# Patient Record
Sex: Male | Born: 1960 | Race: White | Hispanic: No | Marital: Married
Health system: Southern US, Community
[De-identification: ages and names within clinical notes are randomized; demographics above are authoritative.]

---

## 2015-11-17 ENCOUNTER — Emergency Department (HOSPITAL_COMMUNITY)
Admission: EM | Admit: 2015-11-17 | Discharge: 2015-11-18 | Disposition: A | Discharge status: Home / Self Care | Payer: BLUE CROSS/BLUE SHIELD | Attending: Emergency Medicine | Admitting: Emergency Medicine

## 2015-11-17 ENCOUNTER — Emergency Department (HOSPITAL_COMMUNITY): Payer: BLUE CROSS/BLUE SHIELD

## 2015-11-17 DIAGNOSIS — Z791 Long term (current) use of non-steroidal anti-inflammatories (NSAID): Secondary | ICD-10-CM | POA: Insufficient documentation

## 2015-11-17 DIAGNOSIS — B349 Viral infection, unspecified: Secondary | ICD-10-CM | POA: Diagnosis not present

## 2015-11-17 DIAGNOSIS — Z79899 Other long term (current) drug therapy: Secondary | ICD-10-CM | POA: Insufficient documentation

## 2015-11-17 DIAGNOSIS — R52 Pain, unspecified: Secondary | ICD-10-CM | POA: Diagnosis present

## 2015-11-17 LAB — CBC WITH DIFFERENTIAL/PLATELET
BASOS ABS: 0 10*3/uL (ref 0.0–0.1)
Basophils Relative: 0 %
EOS ABS: 0 10*3/uL (ref 0.0–0.7)
Eosinophils Relative: 0 %
HEMATOCRIT: 40.8 % (ref 39.0–52.0)
HEMOGLOBIN: 13.2 g/dL (ref 13.0–17.0)
LYMPHS PCT: 8 %
Lymphs Abs: 2 10*3/uL (ref 0.7–4.0)
MCH: 25.9 pg — ABNORMAL LOW (ref 26.0–34.0)
MCHC: 32.4 g/dL (ref 30.0–36.0)
MCV: 80 fL (ref 78.0–100.0)
MONOS PCT: 9 %
Monocytes Absolute: 2.2 10*3/uL — ABNORMAL HIGH (ref 0.1–1.0)
NEUTROS ABS: 20.2 10*3/uL — AB (ref 1.7–7.7)
NEUTROS PCT: 83 %
Platelets: 412 10*3/uL — ABNORMAL HIGH (ref 150–400)
RBC: 5.1 MIL/uL (ref 4.22–5.81)
RDW: 19.2 % — ABNORMAL HIGH (ref 11.5–15.5)
WBC: 24.4 10*3/uL — ABNORMAL HIGH (ref 4.0–10.5)

## 2015-11-17 LAB — URINALYSIS, ROUTINE W REFLEX MICROSCOPIC
Bilirubin Urine: NEGATIVE
Glucose, UA: NEGATIVE mg/dL
Hgb urine dipstick: NEGATIVE
Ketones, ur: NEGATIVE mg/dL
NITRITE: NEGATIVE
PH: 8 (ref 5.0–8.0)
Protein, ur: NEGATIVE mg/dL
SPECIFIC GRAVITY, URINE: 1.029 (ref 1.005–1.030)

## 2015-11-17 LAB — BASIC METABOLIC PANEL
ANION GAP: 9 (ref 5–15)
BUN: 15 mg/dL (ref 6–20)
CALCIUM: 9.1 mg/dL (ref 8.9–10.3)
CO2: 26 mmol/L (ref 22–32)
Chloride: 103 mmol/L (ref 101–111)
Creatinine, Ser: 0.96 mg/dL (ref 0.61–1.24)
GLUCOSE: 95 mg/dL (ref 65–99)
POTASSIUM: 4.3 mmol/L (ref 3.5–5.1)
Sodium: 138 mmol/L (ref 135–145)

## 2015-11-17 LAB — I-STAT CG4 LACTIC ACID, ED: LACTIC ACID, VENOUS: 1.04 mmol/L (ref 0.5–2.0)

## 2015-11-17 LAB — URINE MICROSCOPIC-ADD ON
RBC / HPF: NONE SEEN RBC/hpf (ref 0–5)
SQUAMOUS EPITHELIAL / LPF: NONE SEEN

## 2015-11-17 MED ORDER — SODIUM CHLORIDE 0.9 % IV BOLUS (SEPSIS)
1500.0000 mL | Freq: Once | INTRAVENOUS | Status: AC
  Administered 2015-11-17: 1500 mL via INTRAVENOUS

## 2015-11-17 MED ORDER — ACETAMINOPHEN 500 MG PO TABS
1000.0000 mg | ORAL_TABLET | Freq: Once | ORAL | Status: AC
  Administered 2015-11-17: 1000 mg via ORAL
  Filled 2015-11-17: qty 2

## 2015-11-17 NOTE — ED Provider Notes (Signed)
CSN: [id number]     Arrival date & time 11/17/15  1944 History  By signing my name below, I, Kenneth Garner, attest that this documentation has been prepared under the direction and in the presence of  Joycie Peek, PA-C. Electronically Signed: Doreatha Garner, ED Scribe. 11/17/2015. 9:22 PM.    Chief Complaint  Patient presents with  . Generalized Body Aches  . Chills   The history is provided by the patient. No language interpreter was used.    HPI Comments: Kenneth Garner is a 55 y.o. male with h/o HTN on Lasix, HLD, gout who presents to the Emergency Department complaining of moderate generalized body aches onset this morning with associated chills, watery eyes. Pt also complains that his urine stream has decreased in volume and he is experiencing urinary hesitancy. He reports he has taken Tylenol with some relief of his symptoms. Pt reports h/o UTI and renal calculi, but is not sure if his current symptoms feel similar. No known sick contacts with similar symptoms. No recent travel outside the country. He reports he recently had 12 testosterone pellets inserted into his left buttock 4 days ago, but has not previously had reactions to this medications. He denies known fever, nausea, emesis, diarrhea, constipation, cough, rhinorrhea, sore throat, dysuria, hematuria, malodorous urine, specific back pain, rectal pain. No h/o CHF or DM.   No past medical history on file. No past surgical history on file. No family history on file. Social History  Substance Use Topics  . Smoking status: Not on file  . Smokeless tobacco: Not on file  . Alcohol Use: Not on file    Review of Systems A 10 point review of systems was completed and was negative except for pertinent positives and negatives as mentioned in the history of present illness.   Allergies  Review of patient's allergies indicates not on file.  Home Medications   Prior to Admission medications   Not on File   BP 129/65 mmHg  Pulse 102   Temp(Src) 98.5 F (36.9 C) (Oral)  Resp 16  Ht  (1.803 m)  Wt 289 lb (131.09 kg)  BMI 40.33 kg/m2  SpO2 98% Physical Exam  Constitutional: He appears well-developed and well-nourished.  HENT:  Head: Normocephalic.  Eyes: Conjunctivae are normal.  Cardiovascular: Normal rate, regular rhythm and normal heart sounds.   No murmur heard. Heart sounds normal.   Pulmonary/Chest: Effort normal and breath sounds normal. No respiratory distress. He has no wheezes.  Lungs CTA bilaterally.   Abdominal: Soft. He exhibits no distension. There is no tenderness. There is no rebound and no guarding.  Genitourinary:  No CVA tenderness.   Musculoskeletal: Normal range of motion.  No cervical, thoracic or lumbar spinal or paraspinal tenderness. No step-offs, crepitus or deformity. FROM of the neck and back.    Neurological: He is alert.  Skin: Skin is warm and dry.  Surgical incision site on left buttock healing well. No induration, erythema or drainage.   Psychiatric: He has a normal mood and affect. His behavior is normal.  Nursing note and vitals reviewed.   ED Course  Procedures (including critical care time) DIAGNOSTIC STUDIES: Oxygen Saturation is 98% on RA, normal by my interpretation.    COORDINATION OF CARE: 9:20 PM Discussed treatment plan with pt at bedside which includes UA, lab work and pt agreed to plan.   Labs Review Labs Reviewed  BASIC METABOLIC PANEL  CBC WITH DIFFERENTIAL/PLATELET  URINALYSIS, ROUTINE W REFLEX MICROSCOPIC (NOT AT [facility])  Imaging Review No results found. I have personally reviewed and evaluated these images and lab results as part of my medical decision-making.  Meds given in ED:  Medications - No data to display  New Prescriptions   No medications on file     Filed Vitals:   11/17/15 1956  BP: 129/65  Pulse: 102  Temp: 98.5 F (36.9 C)  Resp: 16     MDM  Patient presents with generalized body aches and chills since this morning  with associated urinary hesitancy and frequency. On arrival, he is slightly tachycardic at 102, heart rate high 90s on my exam. He reports taking multiple doses of NSAIDs and Tylenol this morning and remains afebrile in the emergency department. Physical exam is grossly unremarkable. However, we'll obtain screening labs, urinalysis to evaluate for possible UTI, chest x-ray for other source of infx. Patient care signed out to oncoming provider, Antony MaduraKelly Humes PA-C for follow-up on labs, imaging and subsequent reevaluation. If no new objective findings, patient may be discharged home with symptomatic support for likely viral illness. Final diagnoses:  Viral illness    I personally performed the services described in this documentation, which was scribed in my presence. The recorded information has been reviewed and is accurate.   Joycie PeekBenjamin Donnis Pecha, PA-C 11/18/15 1132  Gwyneth SproutWhitney Plunkett, MD 11/19/15 [phone]

## 2015-11-17 NOTE — ED Notes (Addendum)
Pt complains of generalized body aches and chills since this morning. Pt states he feels his stream is not as strong when urinating. Pt had testosterone pellets implanted last Thursday, but states he has not had problems with them in the past. Pt is concerned he has the flu.

## 2015-11-17 NOTE — Discharge Instructions (Signed)
There does not appear to be an emergent cause for your symptoms at this time. Your exam, labs, chest x-ray were all reassuring. Her symptoms are likely due to a virus and should resolve on their own. You may continue taking Tylenol, Motrin and other OTC medications to help with your symptoms. Follow-up with your doctor next week for reevaluation. Return to ED for any new or worsening symptoms as discussed.

## 2015-11-18 NOTE — ED Provider Notes (Signed)
12:46 AM Patient reassessed. He states that he feels much better since receiving fluids. His fever has responded well to Tylenol. I considered covering the patient for [facility] with doxycycline; however, he is already on 100 mg doxycycline BID for rosacea which is adequate coverage for this. Leukocytosis may be, in part, secondary to his recent procedure. Doubt severe infection and patient has a normal lactate. Vitals have been stable. No evidence of UTI or CAP.   Patient states that he is returning home tomorrow. I have recommended that he call his doctor to schedule outpatient reevaluation. Tylenol and ibuprofen advised for fever and body aches. No indication for further emergent workup at this time. Patient is agreeable to plan of outpatient management. Patient discharged in satisfactory condition with no unaddressed concerns.  Antony MaduraKelly Fanny Agan, PA-C 11/18/15 [id number]  Gwyneth SproutWhitney Plunkett, MD 11/19/15 0040

## 2015-11-19 LAB — URINE CULTURE
Culture: <10000 — AB
SPECIAL REQUESTS: NORMAL

## 2017-03-20 IMAGING — CR DG CHEST 2V
2 series · 2 of 2 positions shown · non-contrast
Comparison: None.

CLINICAL DATA: Generalized body aches and chills since this
morning. Weak urine stream. History of kidney stones. Nonsmoker.

EXAM:
CHEST  2 VIEW

[w chest pa]
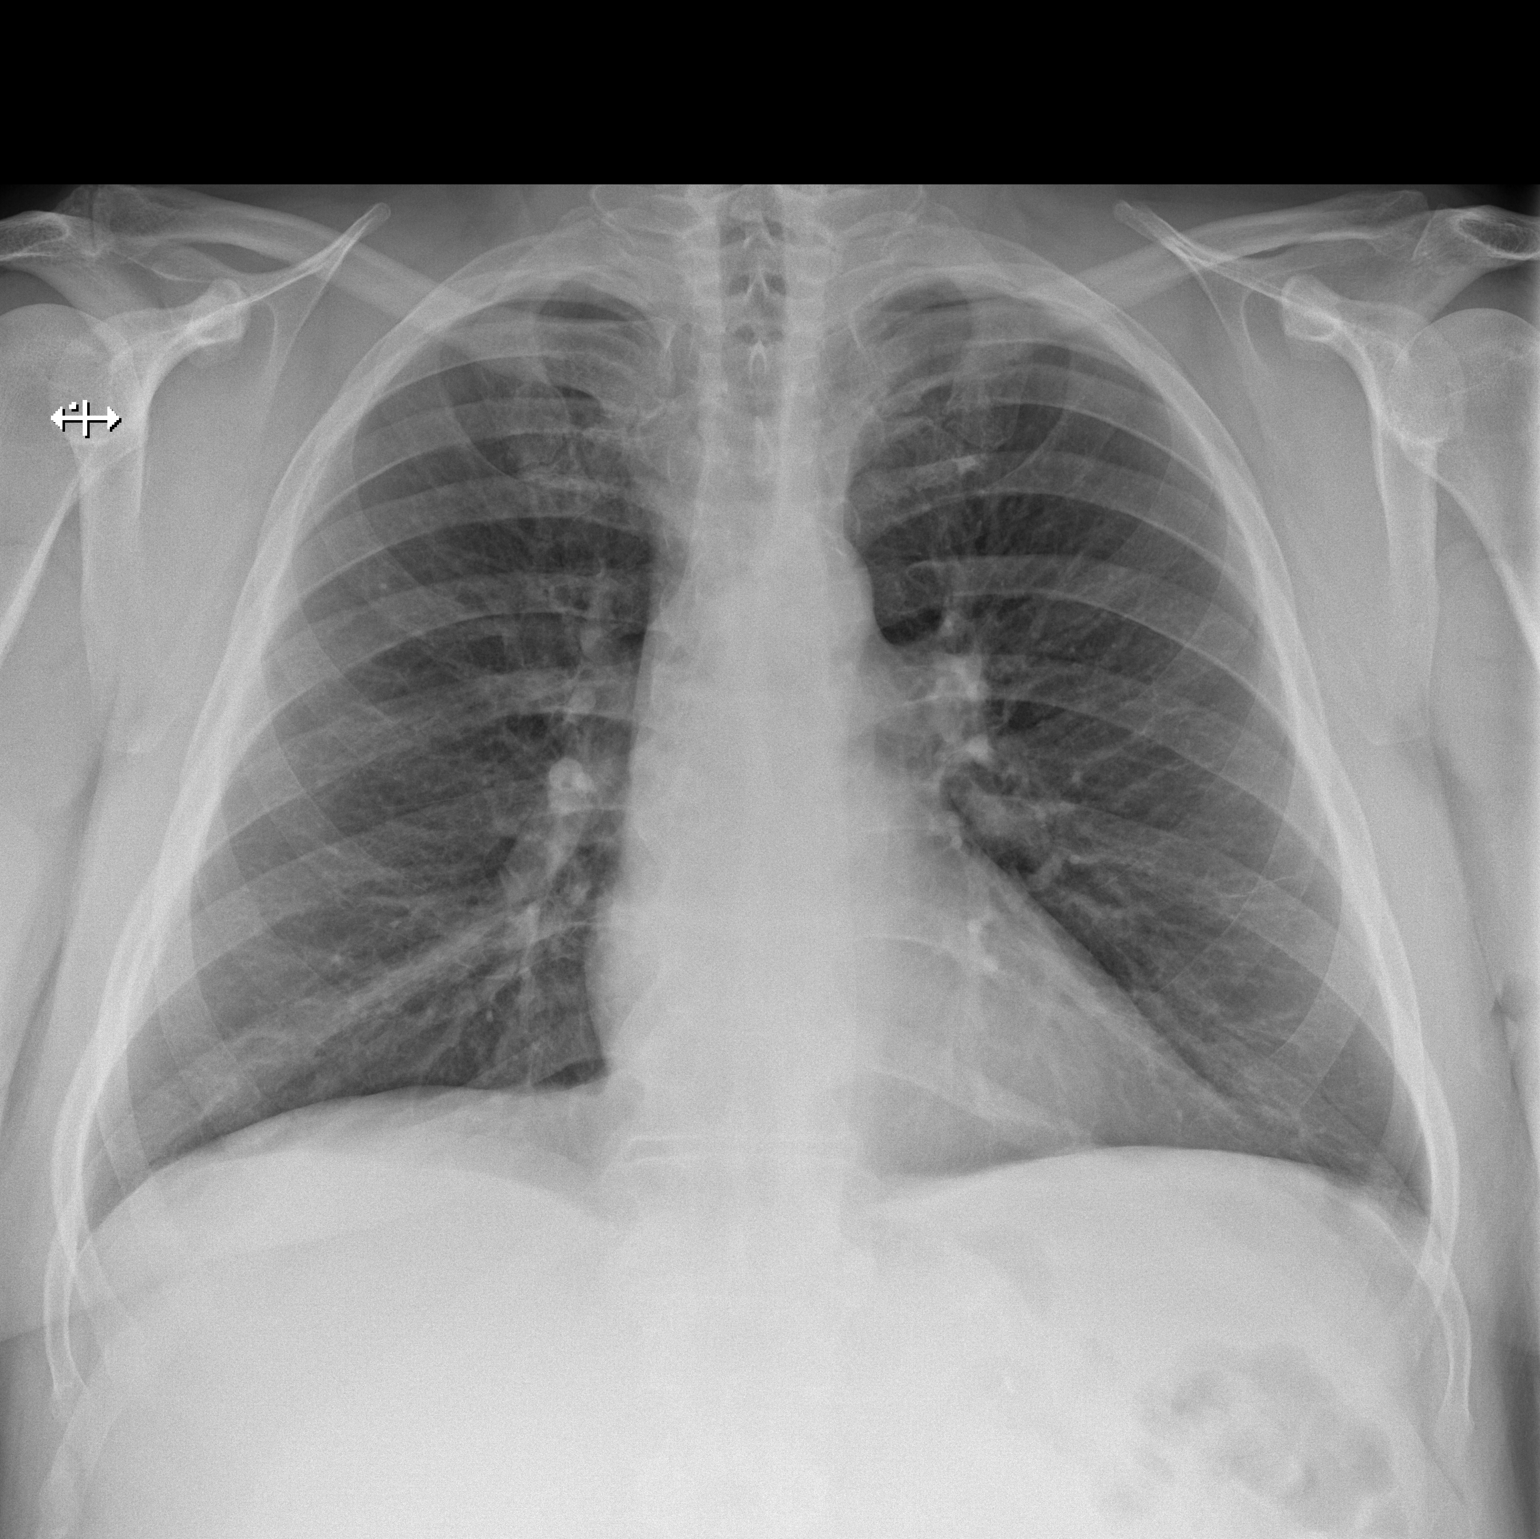

[w chest lat]
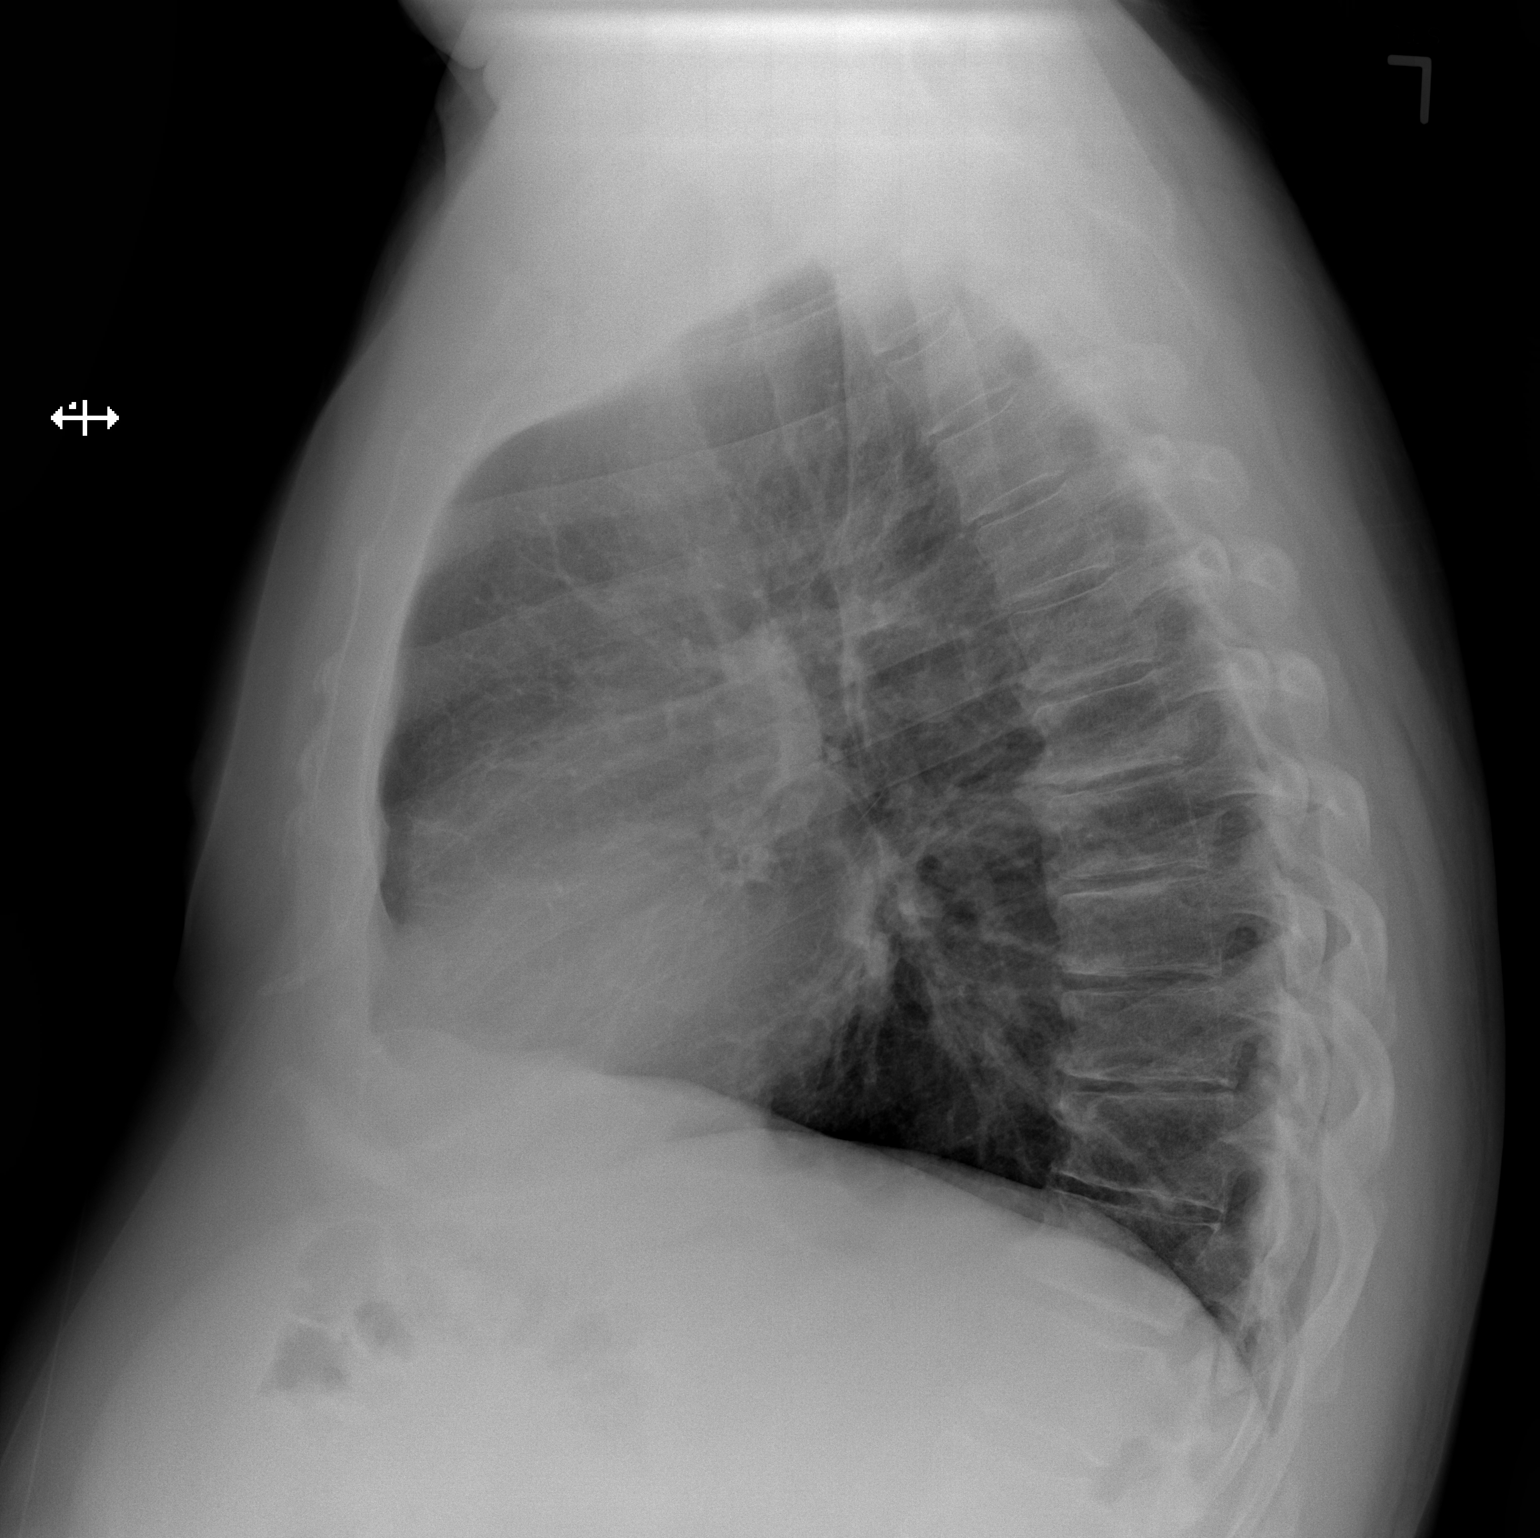

[2 of 2 positions shown; findings below may reference images not displayed]

FINDINGS: Normal heart size and pulmonary vascularity. No focal airspace
disease or consolidation in the lungs. No blunting of costophrenic
angles. No pneumothorax. Mediastinal contours appear intact.
Degenerative changes in the thoracic spine. Calcification of the
aorta.
IMPRESSION: No active cardiopulmonary disease.

## 2021-12-31 DEATH — deceased
